# Patient Record
Sex: Male | Born: 1970 | Race: Black or African American | Hispanic: No | Marital: Married | Smoking: Never smoker
Health system: Southern US, Community
[De-identification: ages and names within clinical notes are randomized; demographics above are authoritative.]

## PROBLEM LIST (undated history)

## (undated) DIAGNOSIS — K219 Gastro-esophageal reflux disease without esophagitis: Secondary | ICD-10-CM

## (undated) DIAGNOSIS — E7800 Pure hypercholesterolemia, unspecified: Secondary | ICD-10-CM

## (undated) DIAGNOSIS — R1310 Dysphagia, unspecified: Secondary | ICD-10-CM

## (undated) DIAGNOSIS — I10 Essential (primary) hypertension: Secondary | ICD-10-CM

## (undated) HISTORY — DX: Dysphagia, unspecified: R13.10

## (undated) HISTORY — PX: BACK SURGERY: SHX140

## (undated) HISTORY — DX: Gastro-esophageal reflux disease without esophagitis: K21.9

---

## 2011-05-04 ENCOUNTER — Inpatient Hospital Stay (INDEPENDENT_AMBULATORY_CARE_PROVIDER_SITE_OTHER)
Admission: RE | Admit: 2011-05-04 | Discharge: 2011-05-04 | Disposition: A | Discharge status: Home / Self Care | Payer: PRIVATE HEALTH INSURANCE | Source: Ambulatory Visit | Attending: Emergency Medicine | Admitting: Emergency Medicine

## 2011-05-04 ENCOUNTER — Ambulatory Visit (INDEPENDENT_AMBULATORY_CARE_PROVIDER_SITE_OTHER): Payer: PRIVATE HEALTH INSURANCE

## 2011-05-04 DIAGNOSIS — J069 Acute upper respiratory infection, unspecified: Secondary | ICD-10-CM

## 2012-04-07 IMAGING — CR DG CHEST 2V
2 series · 2 of 2 positions shown · non-contrast
Comparison: None

CLINICAL DATA: Fever, cough and congestion.

CHEST - 2 VIEW

[view not recorded (1 of 2)]
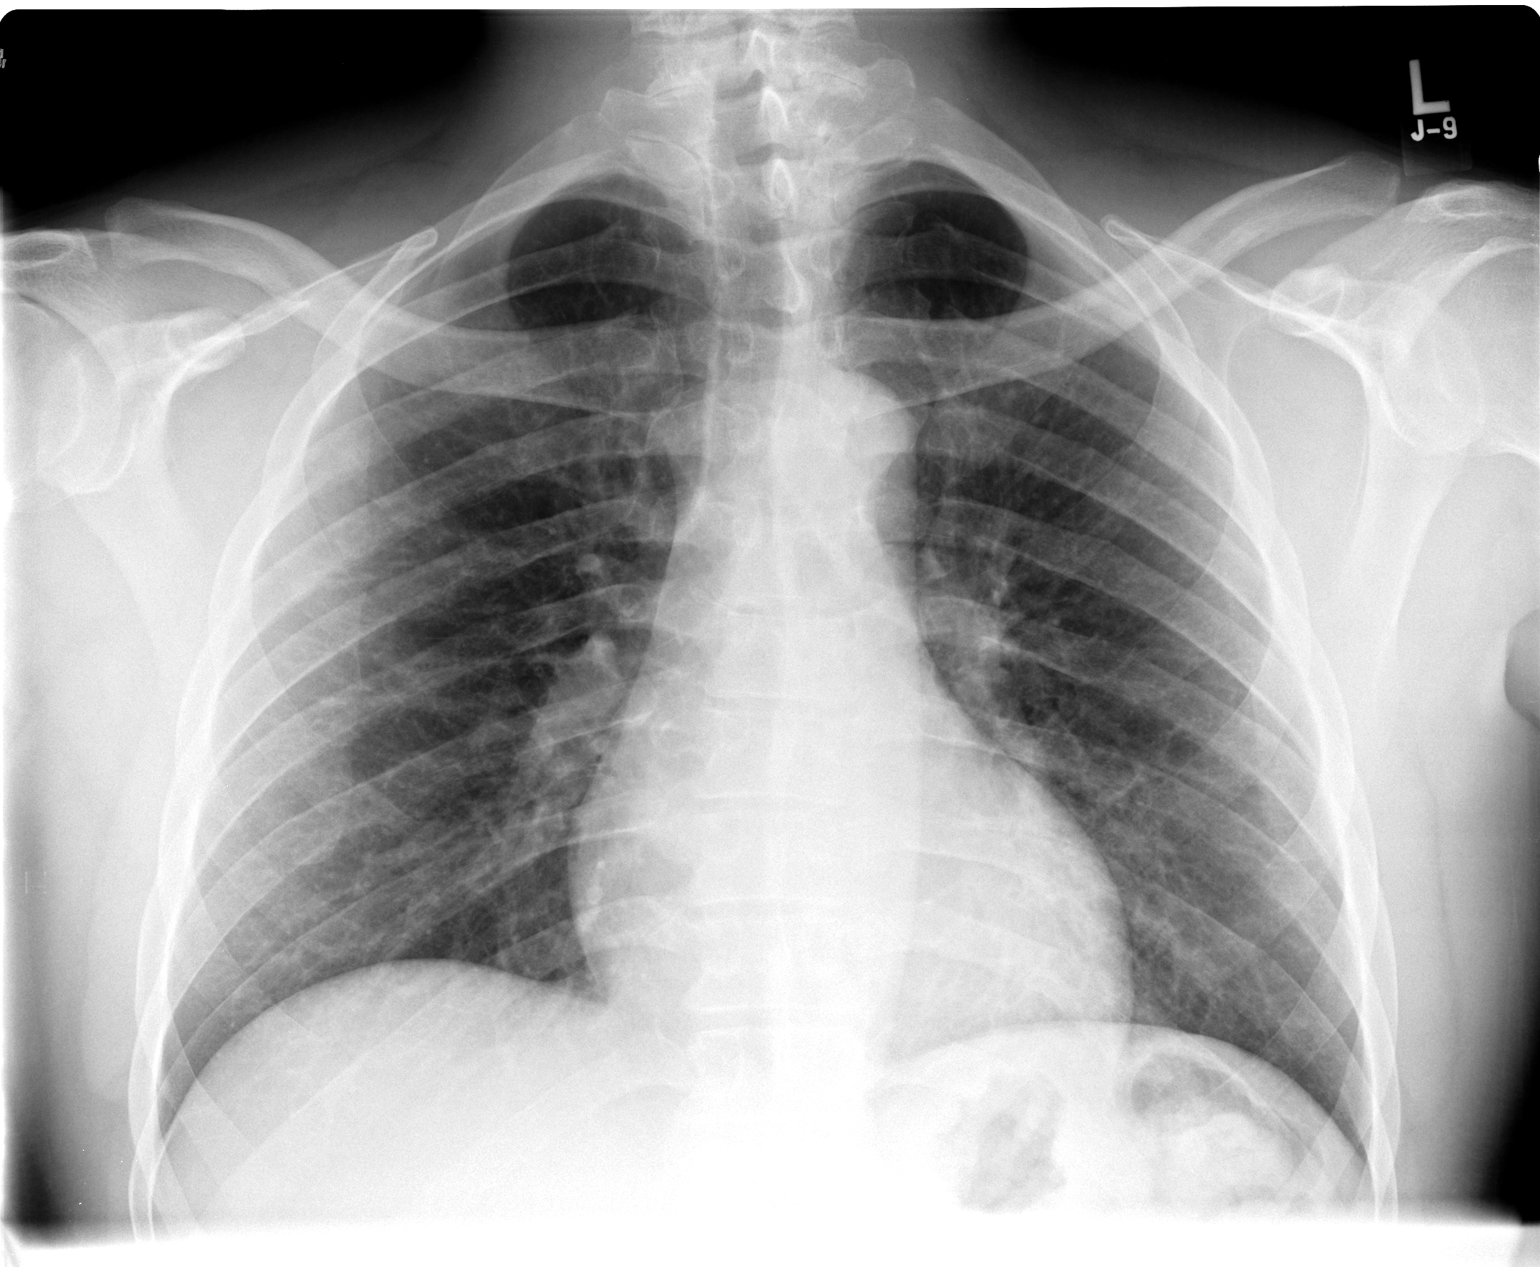

[view not recorded (2 of 2)]
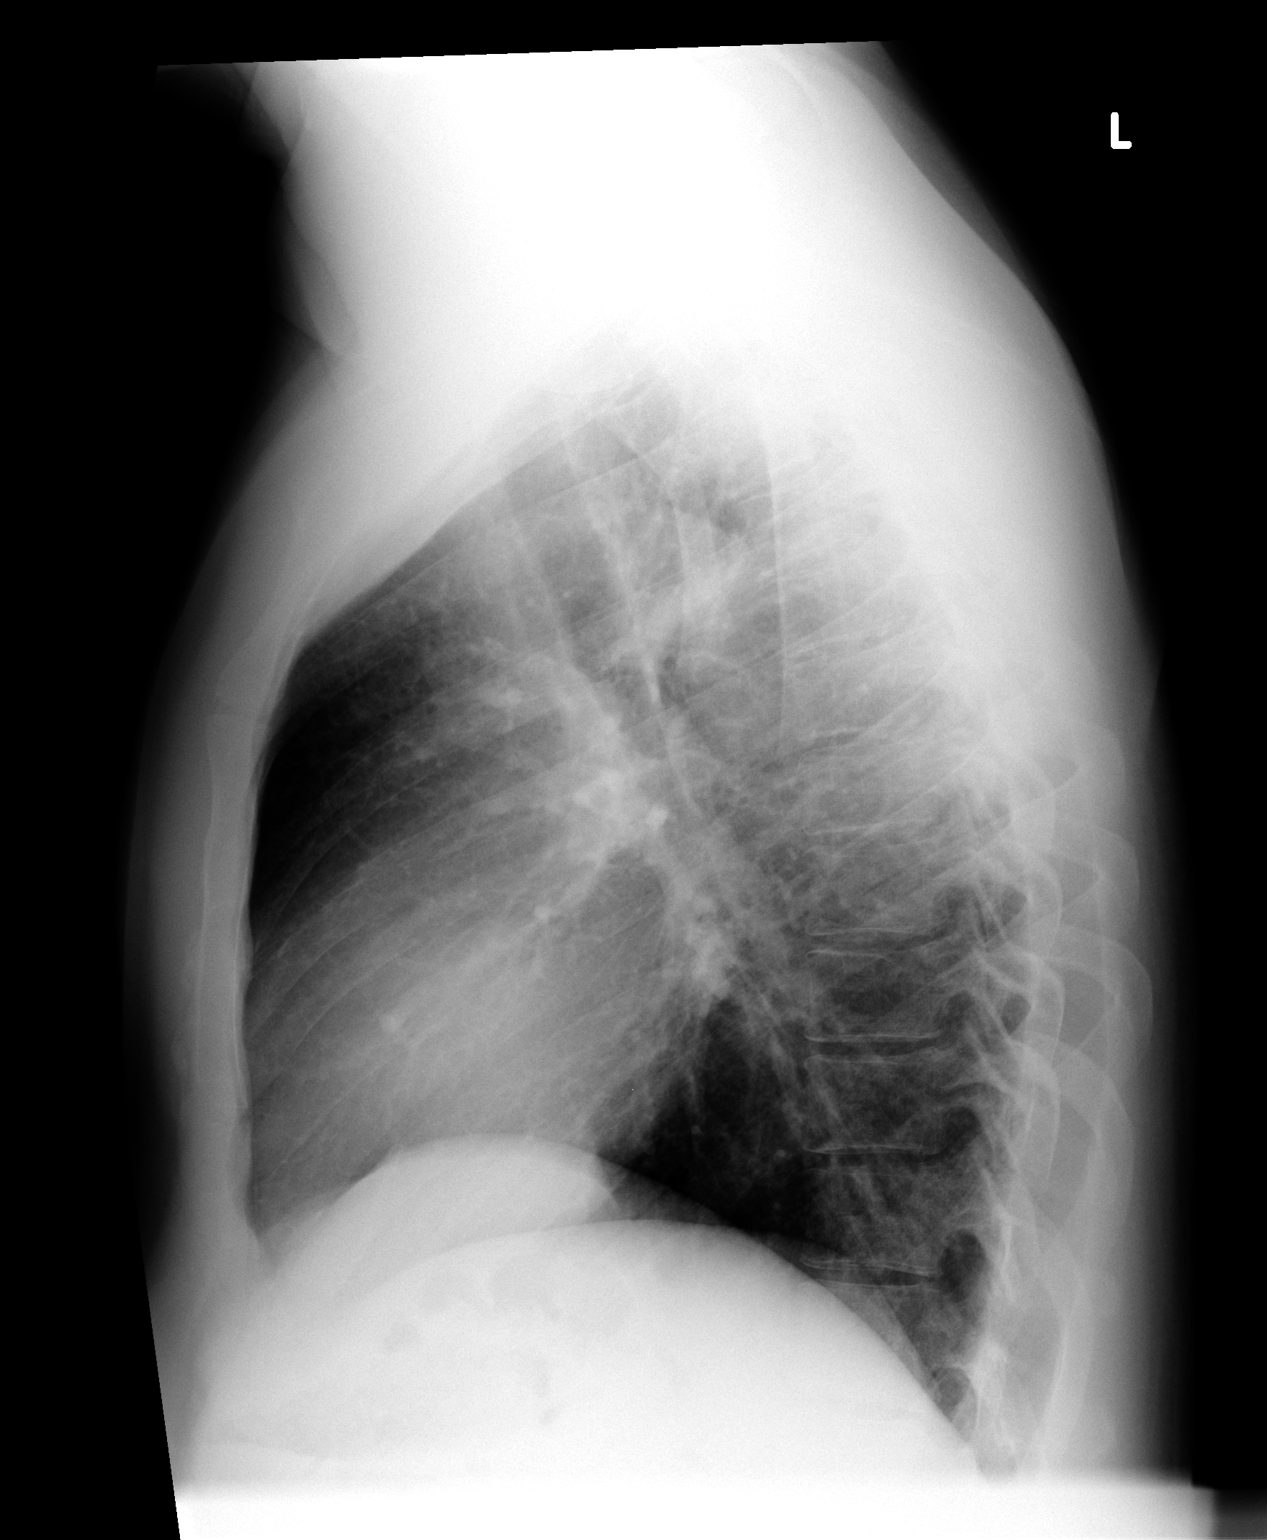

[2 of 2 positions shown; findings below may reference images not displayed]

FINDINGS: The heart size and mediastinal contours are within normal
limits. Mild bronchial thickening present without focal infiltrate.
No edema or pleural fluid.  No bony abnormalities.
IMPRESSION: Bronchial thickening without focal infiltrate.

## 2013-04-30 ENCOUNTER — Ambulatory Visit (INDEPENDENT_AMBULATORY_CARE_PROVIDER_SITE_OTHER): Payer: PRIVATE HEALTH INSURANCE | Admitting: Urology

## 2013-04-30 DIAGNOSIS — Z3009 Encounter for other general counseling and advice on contraception: Secondary | ICD-10-CM

## 2013-06-11 ENCOUNTER — Encounter (INDEPENDENT_AMBULATORY_CARE_PROVIDER_SITE_OTHER): Payer: PRIVATE HEALTH INSURANCE | Admitting: Urology

## 2013-06-11 DIAGNOSIS — Z302 Encounter for sterilization: Secondary | ICD-10-CM

## 2013-06-18 ENCOUNTER — Ambulatory Visit (INDEPENDENT_AMBULATORY_CARE_PROVIDER_SITE_OTHER): Payer: PRIVATE HEALTH INSURANCE | Admitting: Urology

## 2013-06-18 DIAGNOSIS — Z09 Encounter for follow-up examination after completed treatment for conditions other than malignant neoplasm: Secondary | ICD-10-CM

## 2017-08-21 ENCOUNTER — Encounter (INDEPENDENT_AMBULATORY_CARE_PROVIDER_SITE_OTHER): Payer: Self-pay | Admitting: Internal Medicine

## 2017-08-21 ENCOUNTER — Encounter (INDEPENDENT_AMBULATORY_CARE_PROVIDER_SITE_OTHER): Payer: Self-pay

## 2017-09-21 ENCOUNTER — Other Ambulatory Visit (INDEPENDENT_AMBULATORY_CARE_PROVIDER_SITE_OTHER): Payer: Self-pay | Admitting: Internal Medicine

## 2017-09-21 ENCOUNTER — Ambulatory Visit (INDEPENDENT_AMBULATORY_CARE_PROVIDER_SITE_OTHER): Payer: PRIVATE HEALTH INSURANCE | Admitting: Internal Medicine

## 2017-09-21 ENCOUNTER — Encounter (INDEPENDENT_AMBULATORY_CARE_PROVIDER_SITE_OTHER): Payer: Self-pay | Admitting: Internal Medicine

## 2017-09-21 DIAGNOSIS — R1310 Dysphagia, unspecified: Secondary | ICD-10-CM

## 2017-09-21 DIAGNOSIS — K219 Gastro-esophageal reflux disease without esophagitis: Secondary | ICD-10-CM | POA: Diagnosis not present

## 2017-09-21 DIAGNOSIS — R1319 Other dysphagia: Secondary | ICD-10-CM

## 2017-09-21 HISTORY — DX: Gastro-esophageal reflux disease without esophagitis: K21.9

## 2017-09-21 HISTORY — DX: Dysphagia, unspecified: R13.10

## 2017-09-21 NOTE — Patient Instructions (Addendum)
EGD/ED. The risks of bleeding, perforation and infection were reviewed with patient.  

## 2017-09-21 NOTE — Progress Notes (Signed)
   Subjective:    Patient ID: Aaron Miles, male    DOB: 10-Aug-1971, 46 y.o.   MRN: [id number]  HPI Referred by Dr. Sherrie Sport for EGD/ED dysphagia. States he has dysphagia on a daily basis. Chicken and steak especially bother him. Lodge in his mid esophagus.  Symptoms for several months. Underwent an esophagram which was abnormal. See below. Appetite is good. No weight loss. BMs are normal. No melena or BRRB. Has acid reflux and is maintained on Zantac 300mg  BID. Is works for the [employer].    08/17/2017 DG Esophagram: Questionable mild narrowing of the mid cervical esophagus. Small sliding hiatal hernia with mild B ring. No reflux demonstrated.  Barium  tablet passed normally.    Review of Systems Past Medical History:  Diagnosis Date  . Dysphagia 09/21/2017  . GERD (gastroesophageal reflux disease) 09/21/2017    Past Surgical History:  Procedure Laterality Date  . BACK SURGERY      No Known Allergies  No current outpatient prescriptions on file prior to visit.   No current facility-administered medications on file prior to visit.    Current Outpatient Prescriptions  Medication Sig Dispense Refill  . atorvastatin (LIPITOR) 10 MG tablet Take 10 mg by mouth daily.    . cetirizine (ZYRTEC) 10 MG tablet Take 10 mg by mouth daily.    Marland Kitchen lisinopril (PRINIVIL,ZESTRIL) 10 MG tablet Take 10 mg by mouth daily.    . ranitidine (ZANTAC) 300 MG tablet Take 300 mg by mouth 2 (two) times daily.     No current facility-administered medications for this visit.         Objective:   Physical Exam  Blood pressure 130/80, pulse 60, temperature 98.5 F (36.9 C), height 6\' 3"  (1.905 m), weight 225 lb (102.1 kg). Alert and oriented. Skin warm and dry. Oral mucosa is moist.   . Sclera anicteric, conjunctivae is pink. Thyroid not enlarged. No cervical lymphadenopathy. Lungs clear. Heart regular rate and rhythm.  Abdomen is soft. Bowel sounds are positive. No hepatomegaly. No abdominal masses felt. No  tenderness.  No edema to lower extremities.          Assessment & Plan:  Dysphagia. Abnormal Esophagram.  EGD/ED. The risks of bleeding, perforation and infection were reviewed with patient. GERD diet given to patient.

## 2017-09-22 DIAGNOSIS — R1319 Other dysphagia: Secondary | ICD-10-CM | POA: Insufficient documentation

## 2017-09-22 DIAGNOSIS — R1310 Dysphagia, unspecified: Secondary | ICD-10-CM | POA: Insufficient documentation

## 2017-09-28 ENCOUNTER — Encounter (HOSPITAL_COMMUNITY)
Admission: RE | Disposition: A | Discharge status: Home / Self Care | Payer: Self-pay | Source: Ambulatory Visit | Attending: Internal Medicine

## 2017-09-28 ENCOUNTER — Encounter (HOSPITAL_COMMUNITY): Payer: Self-pay

## 2017-09-28 ENCOUNTER — Ambulatory Visit (HOSPITAL_COMMUNITY)
Admission: RE | Admit: 2017-09-28 | Discharge: 2017-09-28 | Disposition: A | Discharge status: Home / Self Care | Payer: PRIVATE HEALTH INSURANCE | Source: Ambulatory Visit | Attending: Internal Medicine | Admitting: Internal Medicine

## 2017-09-28 DIAGNOSIS — R1319 Other dysphagia: Secondary | ICD-10-CM

## 2017-09-28 DIAGNOSIS — R1314 Dysphagia, pharyngoesophageal phase: Secondary | ICD-10-CM | POA: Insufficient documentation

## 2017-09-28 DIAGNOSIS — K222 Esophageal obstruction: Secondary | ICD-10-CM | POA: Diagnosis not present

## 2017-09-28 DIAGNOSIS — R1310 Dysphagia, unspecified: Secondary | ICD-10-CM | POA: Insufficient documentation

## 2017-09-28 DIAGNOSIS — K228 Other specified diseases of esophagus: Secondary | ICD-10-CM | POA: Diagnosis not present

## 2017-09-28 DIAGNOSIS — K449 Diaphragmatic hernia without obstruction or gangrene: Secondary | ICD-10-CM

## 2017-09-28 DIAGNOSIS — K210 Gastro-esophageal reflux disease with esophagitis: Secondary | ICD-10-CM | POA: Diagnosis not present

## 2017-09-28 DIAGNOSIS — Z79899 Other long term (current) drug therapy: Secondary | ICD-10-CM | POA: Insufficient documentation

## 2017-09-28 DIAGNOSIS — K219 Gastro-esophageal reflux disease without esophagitis: Secondary | ICD-10-CM | POA: Diagnosis not present

## 2017-09-28 HISTORY — PX: ESOPHAGOGASTRODUODENOSCOPY: SHX5428

## 2017-09-28 HISTORY — PX: ESOPHAGEAL DILATION: SHX303

## 2017-09-28 SURGERY — EGD (ESOPHAGOGASTRODUODENOSCOPY)
Anesthesia: Moderate Sedation

## 2017-09-28 MED ORDER — LIDOCAINE VISCOUS 2 % MT SOLN
OROMUCOSAL | Status: AC
  Filled 2017-09-28: qty 15

## 2017-09-28 MED ORDER — MIDAZOLAM HCL 5 MG/5ML IJ SOLN
INTRAMUSCULAR | Status: DC | PRN
  Administered 2017-09-28 (×5): 2 mg via INTRAVENOUS

## 2017-09-28 MED ORDER — MEPERIDINE HCL 50 MG/ML IJ SOLN
INTRAMUSCULAR | Status: AC
  Filled 2017-09-28: qty 1

## 2017-09-28 MED ORDER — STERILE WATER FOR IRRIGATION IR SOLN
Status: DC | PRN
  Administered 2017-09-28: 2.5 mL

## 2017-09-28 MED ORDER — LIDOCAINE VISCOUS 2 % MT SOLN
OROMUCOSAL | Status: DC | PRN
  Administered 2017-09-28: 1 via OROMUCOSAL

## 2017-09-28 MED ORDER — SODIUM CHLORIDE 0.9 % IV SOLN
INTRAVENOUS | Status: DC
  Administered 2017-09-28: 20 mL/h via INTRAVENOUS

## 2017-09-28 MED ORDER — MIDAZOLAM HCL 5 MG/5ML IJ SOLN
INTRAMUSCULAR | Status: AC
  Filled 2017-09-28: qty 10

## 2017-09-28 MED ORDER — MEPERIDINE HCL 50 MG/ML IJ SOLN
INTRAMUSCULAR | Status: DC | PRN
  Administered 2017-09-28 (×4): 25 mg via INTRAVENOUS

## 2017-09-28 NOTE — Progress Notes (Signed)
Aaron Miles cannot drive, operate machinery, or sign legal documents until after 5pm on Friday September 29, 2018.

## 2017-09-28 NOTE — Op Note (Signed)
[facility] Patient Name: Aaron Miles Procedure Date: 09/28/2017 2:48 PM MRN: [id number] Date of Birth: [birth year] Attending MD: Hildred Laser , MD CSN: [id number] Age: 46 Admit Type: Outpatient Procedure:                Upper GI endoscopy Indications:              Esophageal dysphagia, Gastro-esophageal reflux                            disease. Abnormal esophagogram Providers:                Hildred Laser, MD, Lurline Del, RN, Randa Spike,                            Technician Referring MD:             Stoney Bang MD, MD Medicines:                Lidocaine spray, Meperidine 100 mg IV, Midazolam 10                            mg IV Complications:            No immediate complications. Estimated Blood Loss:     Estimated blood loss was minimal. Procedure:                Pre-Anesthesia Assessment:                           - Prior to the procedure, a History and Physical                            was performed, and patient medications and                            allergies were reviewed. The patient's tolerance of                            previous anesthesia was also reviewed. The risks                            and benefits of the procedure and the sedation                            options and risks were discussed with the patient.                            All questions were answered, and informed consent                            was obtained. Prior Anticoagulants: The patient has                            taken no previous anticoagulant or antiplatelet  agents. ASA Grade Assessment: I - A normal, healthy                            patient. After reviewing the risks and benefits,                            the patient was deemed in satisfactory condition to                            undergo the procedure.                           After obtaining informed consent, the endoscope was                            passed under direct  vision. Throughout the                            procedure, the patient's blood pressure, pulse, and                            oxygen saturations were monitored continuously. The                            [id number] ([id number]) scope was introduced through the                            mouth, and advanced to the second part of duodenum.                            The upper GI endoscopy was accomplished without                            difficulty. The patient tolerated the procedure                            well. Scope In: 3:10:19 PM Scope Out: 3:27:08 PM Total Procedure Duration: 0 hours 16 minutes 49 seconds  Findings:      The lumen of the mid esophagus was mildly dilated.      The examined esophagus was normal.      Focal edema and erythema esophagitis was found 39 cm from the incisors.      A widely patent Schatzki ring (acquired) was found at the       gastroesophageal junction. The scope was withdrawn. Dilation was       performed with a Maloney dilator with no resistance at 56 Fr. The       dilation site was examined following endoscope reinsertion and showed no       change and no bleeding, mucosal tear or perforation. Ring disrupted with       focal biopsy. No tissue saved.      A 2 cm hiatal hernia was present.      The entire examined stomach was normal.      The duodenal bulb and second portion of the duodenum were normal. Impression:               -  Mildly dilated mid esophagus.                           - Normal esophageal mucosa.                           - Mild changes of reflux esophagitis at GE junction                            (39 cm).                           - Widely patent Schatzki ring. Dilated and                            disrupted with focal biopsy. No tissue saved                           - 2 cm hiatal hernia.                           - Normal stomach.                           - Normal duodenal bulb and second portion of the                             duodenum. Moderate Sedation:      Moderate (conscious) sedation was administered by the endoscopy nurse       and supervised by the endoscopist. The following parameters were       monitored: oxygen saturation, heart rate, blood pressure, CO2       capnography and response to care. Total physician intraservice time was       25 minutes. Recommendation:           - Patient has a contact number available for                            emergencies. The signs and symptoms of potential                            delayed complications were discussed with the                            patient. Return to normal activities tomorrow.                            Written discharge instructions were provided to the                            patient.                           - Resume previous diet today.                           - Continue present medications.                           -  No aspirin, ibuprofen, naproxen, or other                            non-steroidal anti-inflammatory drugs for 3 days.                           - Telephone GI clinic in 1 week. Procedure Code(s):        --- Professional ---                           [phone], Esophagogastroduodenoscopy, flexible,                            transoral; with biopsy, single or multiple                           43450, Dilation of esophagus, by unguided sound or                            bougie, single or multiple passes                           99152, Moderate sedation services provided by the                            same physician or other qualified health care                            professional performing the diagnostic or                            therapeutic service that the sedation supports,                            requiring the presence of an independent trained                            observer to assist in the monitoring of the                            patient's level of consciousness and physiological                             status; initial 15 minutes of intraservice time,                            patient age 22 years or older                           [phone], Moderate sedation services; each additional                            15 minutes intraservice time Diagnosis Code(s):        --- Professional ---  K22.8, Other specified diseases of esophagus                           K21.0, Gastro-esophageal reflux disease with                            esophagitis                           K22.2, Esophageal obstruction                           K44.9, Diaphragmatic hernia without obstruction or                            gangrene                           R13.14, Dysphagia, pharyngoesophageal phase CPT copyright 2016 American Medical Association. All rights reserved. The codes documented in this report are preliminary and upon coder review may  be revised to meet current compliance requirements. Hildred Laser, MD Hildred Laser, MD 09/28/2017 3:42:34 PM This report has been signed electronically. Number of Addenda: 0

## 2017-09-28 NOTE — H&P (Signed)
Aaron Miles is an 46 y.o. male.   Chief Complaint: Patient is here for EGD and ED. HPI: Patient is 46 year old male with several year history of GERD who presents with a few months history of dysphagia to solids. He has no difficulty with liquids. He feels ranitidine works for most part. He may have heartburn with certain foods. He had one episode of food impaction relieved with regurgitation about a month ago. He has good appetite and has not lost any weight. He had barium study last month revealing small sliding hiatal hernia mild Schatzki's ring and questionable minor narrowing to cervical esophagus.  Past Medical History:  Diagnosis Date  . Dysphagia 09/21/2017  . GERD (gastroesophageal reflux disease) 09/21/2017    Past Surgical History:  Procedure Laterality Date  . BACK SURGERY      Family History  Problem Relation Age of Onset  . Other Mother   . High blood pressure Mother   . Glaucoma Mother   . Diabetes Mellitus II Mother   . Hammer toes Father   . Diabetes Mellitus II Father   . High blood pressure Sister   . High Cholesterol Sister   . High blood pressure Brother   . High Cholesterol Brother    Social History:  reports that he has never smoked. He has never used smokeless tobacco. He reports that he does not drink alcohol or use drugs.  Allergies: No Known Allergies  Medications Prior to Admission  Medication Sig Dispense Refill  . atorvastatin (LIPITOR) 10 MG tablet Take 10 mg by mouth daily.    . cetirizine (ZYRTEC) 10 MG tablet Take 10 mg by mouth daily.    Marland Kitchen ibuprofen (ADVIL,MOTRIN) 800 MG tablet Take 800 mg by mouth every 8 (eight) hours as needed (for pain.).    Marland Kitchen lisinopril (PRINIVIL,ZESTRIL) 10 MG tablet Take 10 mg by mouth daily.    . ranitidine (ZANTAC) 300 MG tablet Take 300 mg by mouth 2 (two) times daily.      No results found for this or any previous visit (from the past 48 hour(s)). No results found.  ROS  Blood pressure (!) 142/95, pulse  (!) 55, temperature 98.2 F (36.8 C), temperature source Oral, resp. rate 17, height 6\' 3"  (1.905 m), weight 212 lb (96.2 kg), SpO2 100 %. Physical Exam  Constitutional: He appears well-developed and well-nourished.  HENT:  Mouth/Throat: Oropharynx is clear and moist.  Eyes: Conjunctivae are normal. No scleral icterus.  Neck: No thyromegaly present.  Cardiovascular: Normal rate, regular rhythm and normal heart sounds.   No murmur heard. Respiratory: Effort normal and breath sounds normal.  GI: Soft. He exhibits no distension and no mass. There is no tenderness.  Musculoskeletal: He exhibits no edema.  Lymphadenopathy:    He has no cervical adenopathy.  Neurological: He is alert.  Skin: Skin is warm and dry.     Assessment/Plan Solid food dysphagia in patient with chronic GERD.  Abnormal esophagogram. EGD with ED.  Hildred Laser, MD 09/28/2017, 2:59 PM

## 2017-09-28 NOTE — Discharge Instructions (Signed)
No aspirin or NSAIDs for 3 days. Resume medications and diet as before. No driving for 24 hours. Please call office with progress report in one week.   Upper Endoscopy, Care After Refer to this sheet in the next few weeks. These instructions provide you with information about caring for yourself after your procedure. Your health care provider may also give you more specific instructions. Your treatment has been planned according to current medical practices, but problems sometimes occur. Call your health care provider if you have any problems or questions after your procedure. What can I expect after the procedure? After the procedure, it is common to have:  A sore throat.  Bloating.  Nausea.  Follow these instructions at home:  Follow instructions from your health care provider about what to eat or drink after your procedure.  Return to your normal activities as told by your health care provider. Ask your health care provider what activities are safe for you.  Take over-the-counter and prescription medicines only as told by your health care provider.  Do not drive for 24 hours if you received a sedative.  Keep all follow-up visits as told by your health care provider. This is important. Contact a health care provider if:  You have a sore throat that lasts longer than one day.  You have trouble swallowing. Get help right away if:  You have a fever.  You vomit blood or your vomit looks like coffee grounds.  You have bloody, black, or tarry stools.  You have a severe sore throat or you cannot swallow.  You have difficulty breathing.  You have severe pain in your chest or belly. This information is not intended to replace advice given to you by your health care provider. Make sure you discuss any questions you have with your health care provider. Document Released: 05/22/2012 Document Revised: 04/28/2016 Document Reviewed: 09/03/2015 Elsevier Interactive Patient Education   2017 [employer].

## 2017-10-02 ENCOUNTER — Encounter (HOSPITAL_COMMUNITY): Payer: Self-pay | Admitting: Internal Medicine

## 2020-11-12 ENCOUNTER — Encounter (INDEPENDENT_AMBULATORY_CARE_PROVIDER_SITE_OTHER): Payer: Self-pay | Admitting: *Deleted

## 2021-02-15 ENCOUNTER — Encounter (INDEPENDENT_AMBULATORY_CARE_PROVIDER_SITE_OTHER): Payer: Self-pay | Admitting: *Deleted

## 2021-04-09 ENCOUNTER — Encounter (INDEPENDENT_AMBULATORY_CARE_PROVIDER_SITE_OTHER): Payer: Self-pay

## 2021-04-09 ENCOUNTER — Telehealth (INDEPENDENT_AMBULATORY_CARE_PROVIDER_SITE_OTHER): Payer: Self-pay

## 2021-04-09 ENCOUNTER — Other Ambulatory Visit (INDEPENDENT_AMBULATORY_CARE_PROVIDER_SITE_OTHER): Payer: Self-pay

## 2021-04-09 DIAGNOSIS — Z1211 Encounter for screening for malignant neoplasm of colon: Secondary | ICD-10-CM

## 2021-04-09 MED ORDER — PEG 3350-KCL-NA BICARB-NACL 420 G PO SOLR: 4000.0000 mL | ORAL | 0 refills | Status: DC

## 2021-04-09 NOTE — Telephone Encounter (Signed)
Referring MD/PCP: Hasanaj  Procedure: Tcs  Reason/Indication:  Screening  Has patient had this procedure before?  no  If so, when, by whom and where?    Is there a family history of colon cancer?  no  Who?  What age when diagnosed?    Is patient diabetic? If yes, Type 1 or Type 2   no      Does patient have prosthetic heart valve or mechanical valve?  no  Do you have a pacemaker/defibrillator?  no  Has patient ever had endocarditis/atrial fibrillation? no  Have you had a stroke/heart attack last 6 mths? no  Does patient use oxygen? no  Has patient had joint replacement within last 12 months?  no  Is patient constipated or do they take laxatives? no  Does patient have a history of alcohol/drug use?  no  Is patient on blood thinner such as Coumadin, Plavix and/or Aspirin? no  Do you take medicine for weight loss?  no  For male patients,: do you still have your menstrual cycle? no  Medications: Famotidine 40 mg bid, Lisinopril 10 mg daily, Cetirizine 10 mg bid, atorvastatin 20 mg daily  Allergies: nkda  Procedure date & time: 04/27/21 12:10

## 2021-04-09 NOTE — Telephone Encounter (Signed)
Ok to schedule.  Thanks,  Asmar Brozek Castaneda Mayorga, MD Gastroenterology and Hepatology [facility]  

## 2021-04-09 NOTE — Telephone Encounter (Signed)
Aaron Miles, CMA  

## 2021-04-26 ENCOUNTER — Other Ambulatory Visit: Payer: Self-pay

## 2021-04-26 ENCOUNTER — Other Ambulatory Visit (HOSPITAL_COMMUNITY)
Admission: RE | Admit: 2021-04-26 | Discharge: 2021-04-26 | Disposition: A | Discharge status: Home / Self Care | Payer: PRIVATE HEALTH INSURANCE | Source: Ambulatory Visit | Attending: Gastroenterology | Admitting: Gastroenterology

## 2021-04-26 ENCOUNTER — Encounter (INDEPENDENT_AMBULATORY_CARE_PROVIDER_SITE_OTHER): Payer: Self-pay

## 2021-04-26 DIAGNOSIS — Z01812 Encounter for preprocedural laboratory examination: Secondary | ICD-10-CM | POA: Diagnosis present

## 2021-04-26 DIAGNOSIS — U071 COVID-19: Secondary | ICD-10-CM | POA: Diagnosis not present

## 2021-04-26 LAB — SARS CORONAVIRUS 2 (TAT 6-24 HRS): SARS Coronavirus 2: POSITIVE — AB

## 2021-05-18 ENCOUNTER — Other Ambulatory Visit (HOSPITAL_COMMUNITY): Payer: PRIVATE HEALTH INSURANCE

## 2021-05-19 ENCOUNTER — Ambulatory Visit (HOSPITAL_COMMUNITY): Payer: PRIVATE HEALTH INSURANCE | Admitting: Anesthesiology

## 2021-05-19 ENCOUNTER — Encounter (HOSPITAL_COMMUNITY)
Admission: RE | Disposition: A | Discharge status: Home / Self Care | Payer: Self-pay | Source: Home / Self Care | Attending: Gastroenterology

## 2021-05-19 ENCOUNTER — Encounter (HOSPITAL_COMMUNITY): Payer: Self-pay | Admitting: Gastroenterology

## 2021-05-19 ENCOUNTER — Other Ambulatory Visit: Payer: Self-pay

## 2021-05-19 ENCOUNTER — Ambulatory Visit (HOSPITAL_COMMUNITY)
Admission: RE | Admit: 2021-05-19 | Discharge: 2021-05-19 | Disposition: A | Discharge status: Home / Self Care | Payer: PRIVATE HEALTH INSURANCE | Attending: Gastroenterology | Admitting: Gastroenterology

## 2021-05-19 DIAGNOSIS — K219 Gastro-esophageal reflux disease without esophagitis: Secondary | ICD-10-CM | POA: Diagnosis not present

## 2021-05-19 DIAGNOSIS — E785 Hyperlipidemia, unspecified: Secondary | ICD-10-CM | POA: Insufficient documentation

## 2021-05-19 DIAGNOSIS — D122 Benign neoplasm of ascending colon: Secondary | ICD-10-CM | POA: Diagnosis not present

## 2021-05-19 DIAGNOSIS — I10 Essential (primary) hypertension: Secondary | ICD-10-CM | POA: Diagnosis not present

## 2021-05-19 DIAGNOSIS — Z79899 Other long term (current) drug therapy: Secondary | ICD-10-CM | POA: Insufficient documentation

## 2021-05-19 DIAGNOSIS — Z1211 Encounter for screening for malignant neoplasm of colon: Secondary | ICD-10-CM | POA: Insufficient documentation

## 2021-05-19 HISTORY — PX: COLONOSCOPY WITH PROPOFOL: SHX5780

## 2021-05-19 HISTORY — DX: Pure hypercholesterolemia, unspecified: E78.00

## 2021-05-19 HISTORY — PX: POLYPECTOMY: SHX5525

## 2021-05-19 HISTORY — DX: Essential (primary) hypertension: I10

## 2021-05-19 LAB — HM COLONOSCOPY

## 2021-05-19 SURGERY — COLONOSCOPY WITH PROPOFOL
Anesthesia: General

## 2021-05-19 MED ORDER — LACTATED RINGERS IV SOLN: INTRAVENOUS | Status: DC

## 2021-05-19 MED ORDER — STERILE WATER FOR IRRIGATION IR SOLN
Status: DC | PRN
  Administered 2021-05-19: 100 mL

## 2021-05-19 MED ORDER — PROPOFOL 10 MG/ML IV BOLUS
INTRAVENOUS | Status: DC | PRN
  Administered 2021-05-19: 50 mg via INTRAVENOUS
  Administered 2021-05-19: 60 mg via INTRAVENOUS
  Administered 2021-05-19: 100 mg via INTRAVENOUS
  Administered 2021-05-19: 20 mg via INTRAVENOUS

## 2021-05-19 NOTE — Anesthesia Preprocedure Evaluation (Signed)
Anesthesia Evaluation  Patient identified by MRN, date of birth, ID band Patient awake    Reviewed: Allergy & Precautions, H&P , NPO status , Patient's Chart, lab work & pertinent test results, reviewed documented beta blocker date and time   Airway Mallampati: II  TM Distance: >3 FB Neck ROM: full    Dental no notable dental hx.    Pulmonary neg pulmonary ROS,    Pulmonary exam normal breath sounds clear to auscultation       Cardiovascular Exercise Tolerance: Good hypertension, negative cardio ROS   Rhythm:regular Rate:Normal     Neuro/Psych negative neurological ROS  negative psych ROS   GI/Hepatic Neg liver ROS, GERD  Medicated,  Endo/Other  negative endocrine ROS  Renal/GU negative Renal ROS  negative genitourinary   Musculoskeletal   Abdominal   Peds  Hematology negative hematology ROS (+)   Anesthesia Other Findings   Reproductive/Obstetrics negative OB ROS                             Anesthesia Physical Anesthesia Plan  ASA: 2  Anesthesia Plan: General   Post-op Pain Management:    Induction:   PONV Risk Score and Plan: Propofol infusion  Airway Management Planned:   Additional Equipment:   Intra-op Plan:   Post-operative Plan:   Informed Consent: I have reviewed the patients History and Physical, chart, labs and discussed the procedure including the risks, benefits and alternatives for the proposed anesthesia with the patient or authorized representative who has indicated his/her understanding and acceptance.     Dental Advisory Given  Plan Discussed with: CRNA  Anesthesia Plan Comments:         Anesthesia Quick Evaluation  

## 2021-05-19 NOTE — Op Note (Signed)
[facility] Patient Name: Aaron Miles Procedure Date: 05/19/2021 10:30 AM MRN: [id number] Date of Birth: 05/22/1971 Attending MD: Maylon Peppers ,  CSN: [id number] Age: 50 Admit Type: Outpatient Procedure:                Colonoscopy Indications:               Providers:                Maylon Peppers, Rosina Lowenstein, RN, Aram Candela Referring MD:              Medicines:                Monitored Anesthesia Care Complications:            No immediate complications. Estimated Blood Loss:     Estimated blood loss: none. Procedure:                Pre-Anesthesia Assessment:                           - Prior to the procedure, a History and Physical                            was performed, and patient medications, allergies                            and sensitivities were reviewed. The patient's                            tolerance of previous anesthesia was reviewed.                           - The risks and benefits of the procedure and the                            sedation options and risks were discussed with the                            patient. All questions were answered and informed                            consent was obtained.                           - ASA Grade Assessment: II - A patient with mild                            systemic disease.                           After obtaining informed consent, the colonoscope                            was passed under direct vision. Throughout the                            procedure, the patient's blood pressure, pulse, and  oxygen saturations were monitored continuously. The                            [id number]-[id number] ([id number]) scope was introduced through                            the anus and advanced to the the cecum, identified                            by appendiceal orifice and ileocecal valve. The                            colonoscopy was performed without difficulty. The                             patient tolerated the procedure well. The quality                            of the bowel preparation was good. Scope In: 11:00:24 AM Scope Out: 11:24:34 AM Scope Withdrawal Time: 0 hours 18 minutes 17 seconds  Total Procedure Duration: 0 hours 24 minutes 10 seconds  Findings:      The perianal and digital rectal examinations were normal.      A 5 mm polyp was found in the ascending colon. The polyp was sessile.       The polyp was removed with a cold snare. Resection and retrieval were       complete.      The retroflexed view of the distal rectum and anal verge was normal and       showed no anal or rectal abnormalities. Impression:               - One 5 mm polyp in the ascending colon, removed                            with a cold snare. Resected and retrieved.                           - The distal rectum and anal verge are normal on                            retroflexion view. Moderate Sedation:      Per Anesthesia Care Recommendation:           - Discharge patient to home (ambulatory).                           - Resume previous diet.                           - Await pathology results.                           - Repeat colonoscopy for surveillance based on                            pathology results. Procedure  Code(s):        --- Professional ---                           [phone], Colonoscopy, flexible; with removal of                            tumor(s), polyp(s), or other lesion(s) by snare                            technique Diagnosis Code(s):        --- Professional ---                           K63.5, Polyp of colon CPT copyright 2019 American Medical Association. All rights reserved. The codes documented in this report are preliminary and upon coder review may  be revised to meet current compliance requirements. Maylon Peppers, MD Maylon Peppers,  05/19/2021 11:30:21 AM This report has been signed electronically. Number of Addenda: 0

## 2021-05-19 NOTE — Discharge Instructions (Addendum)
You are being discharged to home.  Resume your previous diet.  We are waiting for your pathology results.  Your physician has recommended a repeat colonoscopy for surveillance based on pathology results.  

## 2021-05-19 NOTE — H&P (Signed)
Aaron Miles is an 50 y.o. male.   Chief Complaint: Screening colonoscopy HPI: 50 year old male with past medical history of GERD, hyperlipidemia and hypertension, coming for screening colonoscopy. The patient has never had a colonoscopy in the past.  The patient denies having any complaints such as melena, hematochezia, abdominal pain or distention, change in her bowel movement consistency or frequency, no changes in her weight recently.  No family history of colorectal cancer.   Past Medical History:  Diagnosis Date   Dysphagia 09/21/2017   Elevated cholesterol    GERD (gastroesophageal reflux disease) 09/21/2017   Hypertension     Past Surgical History:  Procedure Laterality Date   BACK SURGERY     ESOPHAGEAL DILATION N/A 09/28/2017   Procedure: ESOPHAGEAL DILATION;  Surgeon: Aaron Houston, MD;  Location: [facility];  Service: Endoscopy;  Laterality: N/A;   ESOPHAGOGASTRODUODENOSCOPY N/A 09/28/2017   Procedure: ESOPHAGOGASTRODUODENOSCOPY (EGD);  Surgeon: Aaron Houston, MD;  Location: [facility];  Service: Endoscopy;  Laterality: N/A;  2:35 - Refused to come earlier    Family History  Problem Relation Age of Onset   Other Mother    High blood pressure Mother    Glaucoma Mother    Diabetes Mellitus II Mother    Hammer toes Father    Diabetes Mellitus II Father    High blood pressure Sister    High Cholesterol Sister    High blood pressure Brother    High Cholesterol Brother    Social History:  reports that he has never smoked. He has never used smokeless tobacco. He reports that he does not drink alcohol and does not use drugs.  Allergies: No Known Allergies  Medications Prior to Admission  Medication Sig Dispense Refill   atorvastatin (LIPITOR) 20 MG tablet Take 20 mg by mouth daily.     cetirizine (ZYRTEC) 10 MG tablet Take 10 mg by mouth 2 (two) times daily.     cholecalciferol (VITAMIN D3) 25 MCG (1000 UNIT) tablet Take 1,000 Units by mouth daily.      famotidine (PEPCID) 40 MG tablet Take 40 mg by mouth 2 (two) times daily.     ketoconazole (NIZORAL) 2 % cream Apply 1 application topically daily as needed for irritation.     lisinopril (PRINIVIL,ZESTRIL) 10 MG tablet Take 10 mg by mouth daily.     Omega-3 Fatty Acids (FISH OIL) 1200 MG CAPS Take 1,200 mg by mouth daily.     vitamin B-12 (CYANOCOBALAMIN) 1000 MCG tablet Take 1,000 mcg by mouth daily.     polyethylene glycol-electrolytes (TRILYTE) 420 g solution Take 4,000 mLs by mouth as directed. 4000 mL 0    No results found for this or any previous visit (from the past 48 hour(s)). No results found.  Review of Systems  Constitutional: Negative.   HENT: Negative.    Eyes: Negative.   Respiratory: Negative.    Cardiovascular: Negative.   Gastrointestinal: Negative.   Endocrine: Negative.   Genitourinary: Negative.   Musculoskeletal: Negative.   Skin: Negative.   Allergic/Immunologic: Negative.   Neurological: Negative.   Hematological: Negative.   Psychiatric/Behavioral: Negative.     Blood pressure (!) 145/103, pulse (!) 54, temperature 98.2 F (36.8 C), temperature source Oral, resp. rate 17, height 6\' 3"  (1.905 m), weight 93 kg, SpO2 100 %. Physical Exam  GENERAL: The patient is AO x3, in no acute distress. HEENT: Head is normocephalic and atraumatic. EOMI are intact. Mouth is well hydrated and without lesions. NECK: Supple.  No masses LUNGS: Clear to auscultation. No presence of rhonchi/wheezing/rales. Adequate chest expansion HEART: RRR, normal s1 and s2. ABDOMEN: Soft, nontender, no guarding, no peritoneal signs, and nondistended. BS +. No masses. EXTREMITIES: Without any cyanosis, clubbing, rash, lesions or edema. NEUROLOGIC: AOx3, no focal motor deficit. SKIN: no jaundice, no rashes  Assessment/Plan 50 year old male with past medical history of GERD, hyperlipidemia and hypertension, coming for screening colonoscopy. The patient is at average risk for colorectal  cancer.  We will proceed with colonoscopy today.   [name], MD 05/19/2021, 10:47 AM

## 2021-05-19 NOTE — Transfer of Care (Signed)
Immediate Anesthesia Transfer of Care Note  Patient: Aaron Miles  Procedure(s) Performed: COLONOSCOPY WITH PROPOFOL POLYPECTOMY  Patient Location: Endoscopy Unit  Anesthesia Type:General  Level of Consciousness: awake  Airway & Oxygen Therapy: Patient Spontanous Breathing  Post-op Assessment: Report given to RN  Post vital signs: Reviewed  Last Vitals:  Vitals Value Taken Time  BP    Temp    Pulse    Resp    SpO2      Last Pain:  Vitals:   05/19/21 1056  TempSrc:   PainSc: 0-No pain      Patients Stated Pain Goal: 10 (14/38/88 7579)  Complications: No notable events documented.

## 2021-05-19 NOTE — Anesthesia Postprocedure Evaluation (Signed)
Anesthesia Post Note  Patient: Aaron Miles  Procedure(s) Performed: COLONOSCOPY WITH PROPOFOL POLYPECTOMY  Patient location during evaluation: Endoscopy Anesthesia Type: General Level of consciousness: awake and alert and oriented Pain management: pain level controlled Vital Signs Assessment: post-procedure vital signs reviewed and stable Respiratory status: spontaneous breathing Cardiovascular status: stable Postop Assessment: no apparent nausea or vomiting Anesthetic complications: no   No notable events documented.   Last Vitals:  Vitals:   05/19/21 1043 05/19/21 1129  BP: (!) 145/103 118/81  Pulse: (!) 54   Resp: 17 11  Temp: 36.8 C 36.7 C  SpO2: 100% 100%    Last Pain:  Vitals:   05/19/21 1129  TempSrc: Oral  PainSc: 0-No pain                 Rosario Duey

## 2021-05-20 LAB — SURGICAL PATHOLOGY

## 2021-05-26 ENCOUNTER — Encounter (INDEPENDENT_AMBULATORY_CARE_PROVIDER_SITE_OTHER): Payer: Self-pay | Admitting: *Deleted

## 2021-05-26 ENCOUNTER — Encounter (HOSPITAL_COMMUNITY): Payer: Self-pay | Admitting: Gastroenterology
